# Patient Record
Sex: Female | Born: 2005 | Race: Black or African American | Hispanic: No | Marital: Single | Smoking: Never smoker
Health system: Southern US, Community
[De-identification: ages and names within clinical notes are randomized; demographics above are authoritative.]

## PROBLEM LIST (undated history)

## (undated) DIAGNOSIS — H6690 Otitis media, unspecified, unspecified ear: Secondary | ICD-10-CM

## (undated) DIAGNOSIS — J40 Bronchitis, not specified as acute or chronic: Secondary | ICD-10-CM

## (undated) DIAGNOSIS — K259 Gastric ulcer, unspecified as acute or chronic, without hemorrhage or perforation: Secondary | ICD-10-CM

## (undated) DIAGNOSIS — N390 Urinary tract infection, site not specified: Secondary | ICD-10-CM

## (undated) DIAGNOSIS — J45909 Unspecified asthma, uncomplicated: Secondary | ICD-10-CM

## (undated) DIAGNOSIS — L309 Dermatitis, unspecified: Secondary | ICD-10-CM

## (undated) DIAGNOSIS — J039 Acute tonsillitis, unspecified: Secondary | ICD-10-CM

## (undated) DIAGNOSIS — K5900 Constipation, unspecified: Secondary | ICD-10-CM

## (undated) DIAGNOSIS — T7840XA Allergy, unspecified, initial encounter: Secondary | ICD-10-CM

## (undated) DIAGNOSIS — R51 Headache: Secondary | ICD-10-CM

## (undated) DIAGNOSIS — J020 Streptococcal pharyngitis: Secondary | ICD-10-CM

## (undated) HISTORY — PX: ADENOIDECTOMY: SUR15

## (undated) HISTORY — PX: OTHER SURGICAL HISTORY: SHX169

## (undated) HISTORY — PX: TONSILLECTOMY: SUR1361

---
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
## 2024-06-22 DIAGNOSIS — Z3202 Encounter for pregnancy test, result negative: Secondary | ICD-10-CM | POA: Diagnosis not present

## 2024-06-22 DIAGNOSIS — N760 Acute vaginitis: Secondary | ICD-10-CM | POA: Diagnosis not present

## 2024-06-22 DIAGNOSIS — Z30016 Encounter for initial prescription of transdermal patch hormonal contraceptive device: Secondary | ICD-10-CM | POA: Diagnosis not present

## 2024-06-22 DIAGNOSIS — Z113 Encounter for screening for infections with a predominantly sexual mode of transmission: Secondary | ICD-10-CM | POA: Diagnosis not present

## 2024-08-24 DIAGNOSIS — Z113 Encounter for screening for infections with a predominantly sexual mode of transmission: Secondary | ICD-10-CM | POA: Diagnosis not present

## 2024-08-24 DIAGNOSIS — Z30014 Encounter for initial prescription of intrauterine contraceptive device: Secondary | ICD-10-CM | POA: Diagnosis not present
